# Patient Record
Sex: Male | Born: 1975 | Race: White | Hispanic: No | Marital: Married | Smoking: Current every day smoker
Health system: Southern US, Community
[De-identification: ages and names within clinical notes are randomized; demographics above are authoritative.]

## PROBLEM LIST (undated history)

## (undated) DIAGNOSIS — K259 Gastric ulcer, unspecified as acute or chronic, without hemorrhage or perforation: Secondary | ICD-10-CM

## (undated) DIAGNOSIS — D3A092 Benign carcinoid tumor of the stomach: Secondary | ICD-10-CM

## (undated) DIAGNOSIS — M549 Dorsalgia, unspecified: Secondary | ICD-10-CM

## (undated) HISTORY — PX: BACK SURGERY: SHX140

---

## 2003-09-12 ENCOUNTER — Emergency Department (HOSPITAL_COMMUNITY): Admission: EM | Admit: 2003-09-12 | Discharge: 2003-09-12 | Payer: Self-pay | Admitting: *Deleted

## 2003-10-12 ENCOUNTER — Inpatient Hospital Stay (HOSPITAL_COMMUNITY): Admission: AC | Admit: 2003-10-12 | Discharge: 2003-10-14 | Payer: Self-pay

## 2003-11-24 ENCOUNTER — Encounter: Admission: RE | Admit: 2003-11-24 | Discharge: 2003-11-24 | Payer: Self-pay | Admitting: Neurological Surgery

## 2003-12-09 ENCOUNTER — Ambulatory Visit (HOSPITAL_COMMUNITY): Admission: RE | Admit: 2003-12-09 | Discharge: 2003-12-09 | Payer: Self-pay | Admitting: Unknown Physician Specialty

## 2004-03-25 ENCOUNTER — Encounter: Admission: RE | Admit: 2004-03-25 | Discharge: 2004-03-25 | Payer: Self-pay | Admitting: Specialist

## 2004-05-04 ENCOUNTER — Encounter: Admission: RE | Admit: 2004-05-04 | Discharge: 2004-05-04 | Payer: Self-pay | Admitting: Specialist

## 2004-06-12 ENCOUNTER — Ambulatory Visit: Payer: Self-pay | Admitting: Psychiatry

## 2004-06-13 ENCOUNTER — Inpatient Hospital Stay (HOSPITAL_COMMUNITY): Admission: EM | Admit: 2004-06-13 | Discharge: 2004-06-18 | Payer: Self-pay | Admitting: Psychiatry

## 2004-07-08 ENCOUNTER — Inpatient Hospital Stay (HOSPITAL_COMMUNITY): Admission: RE | Admit: 2004-07-08 | Discharge: 2004-07-12 | Payer: Self-pay | Admitting: Specialist

## 2004-07-17 ENCOUNTER — Emergency Department (HOSPITAL_COMMUNITY): Admission: EM | Admit: 2004-07-17 | Discharge: 2004-07-18 | Payer: Self-pay | Admitting: Emergency Medicine

## 2004-09-18 ENCOUNTER — Emergency Department (HOSPITAL_COMMUNITY): Admission: EM | Admit: 2004-09-18 | Discharge: 2004-09-18 | Payer: Self-pay

## 2006-08-28 IMAGING — CT CT HEAD W/O CM
1 of 2 series · 13 of 30 positions shown, 17 images · non-contrast
Comparison: none

CLINICAL DATA: Seizure, no headache

[Series 2: brain · axial · 0.47mm/px · z∈[+169,+291]mm · 13 of 28 slices shown, 17 images]
[im 2/28  brain]
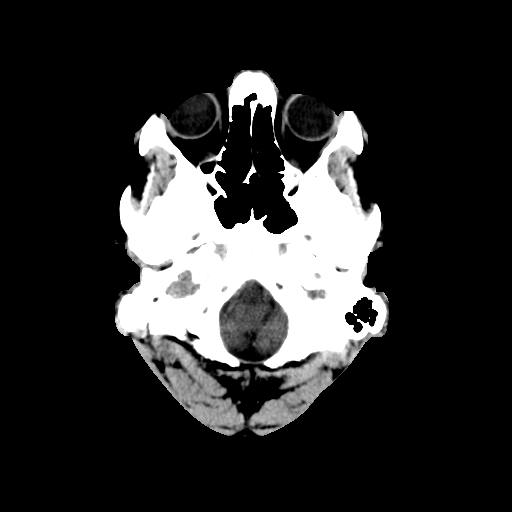
[im 2/28  bone]
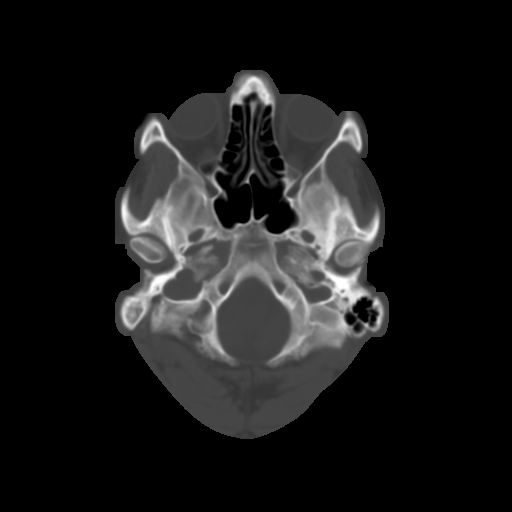
[im 4/28  brain]
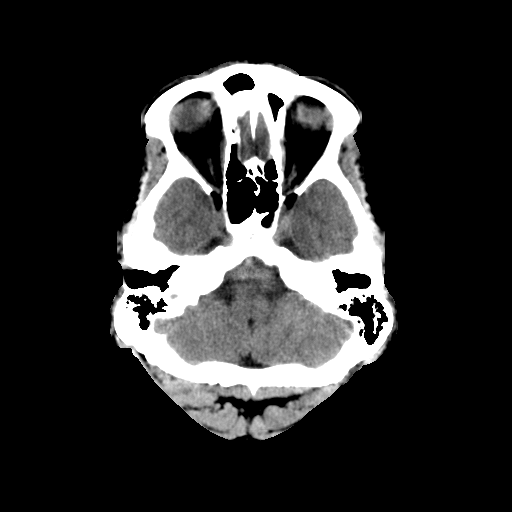
[im 6/28  brain]
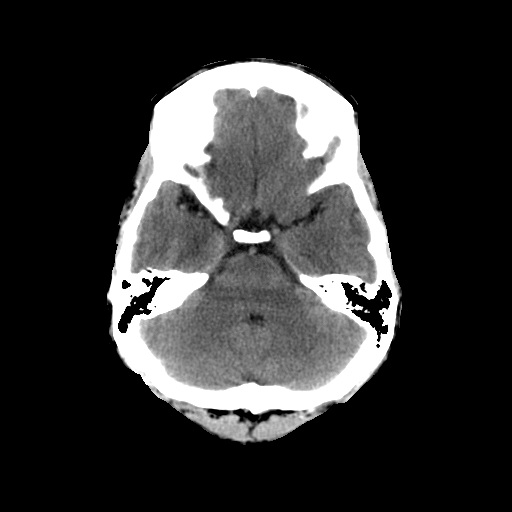
[im 8/28  brain]
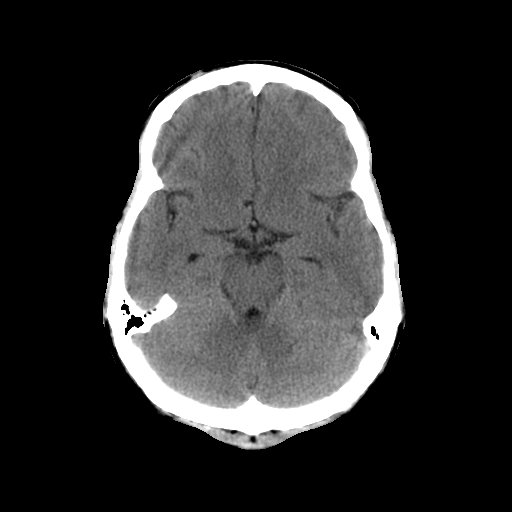
[im 10/28  brain]
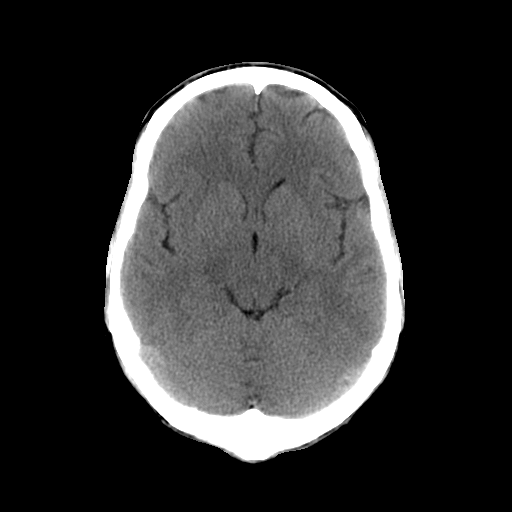
[im 10/28  bone]
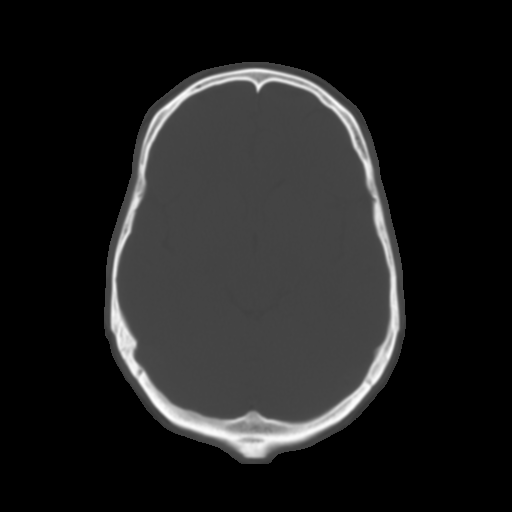
[im 12/28  brain]
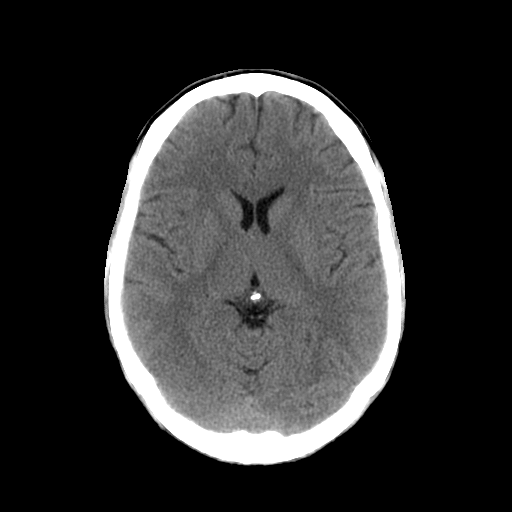
[im 14/28  brain]
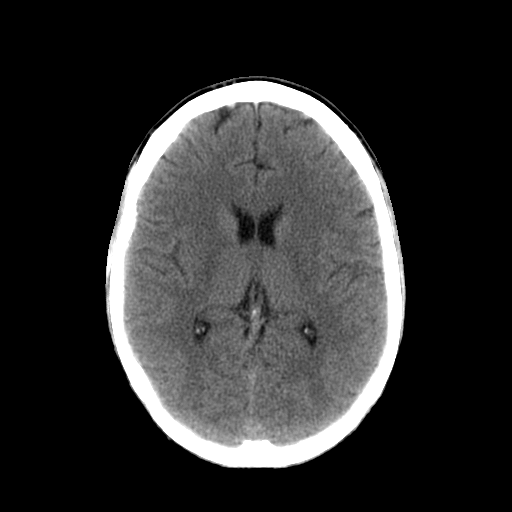
[im 16/28  brain]
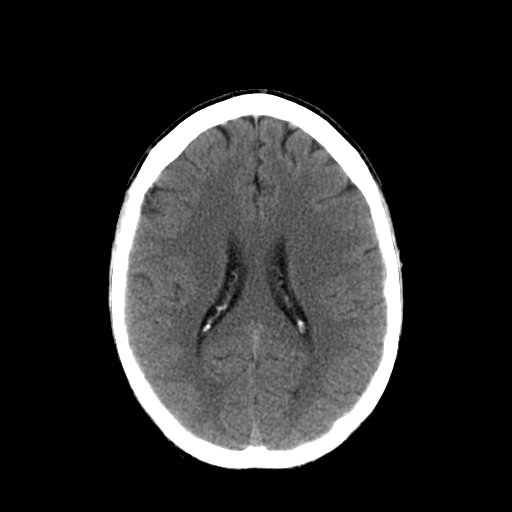
[im 18/28  brain]
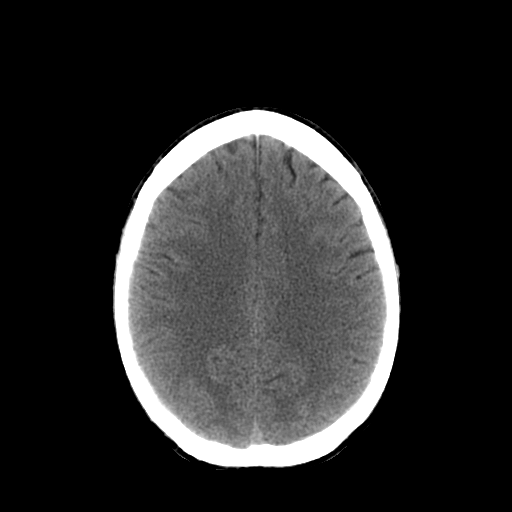
[im 18/28  bone]
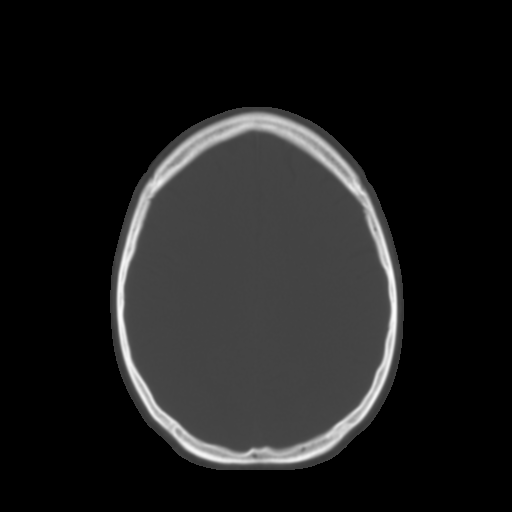
[im 20/28  brain]
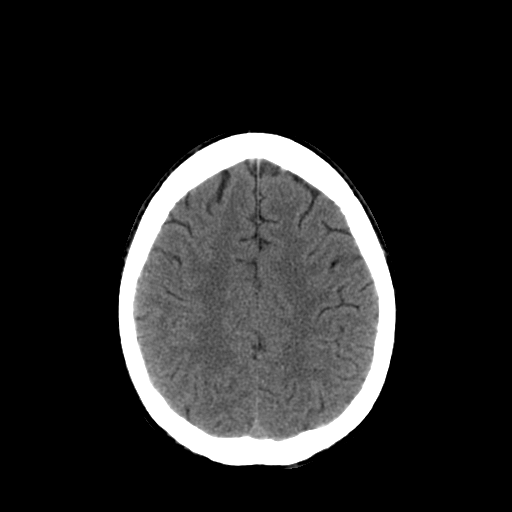
[im 22/28  brain]
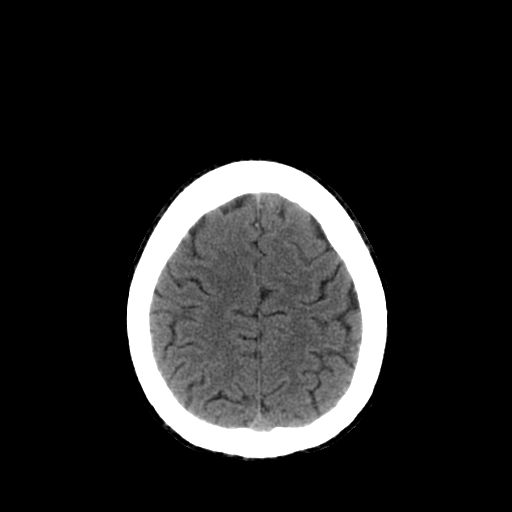
[im 24/28  brain]
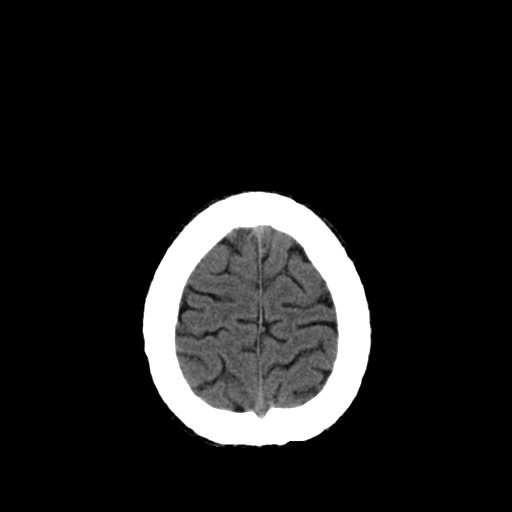
[im 26/28  brain]
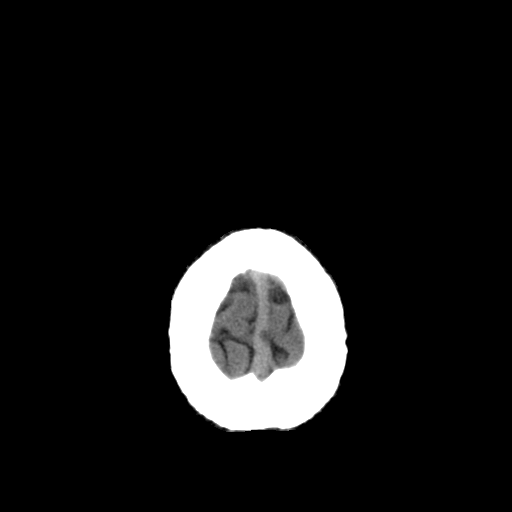
[im 26/28  bone]
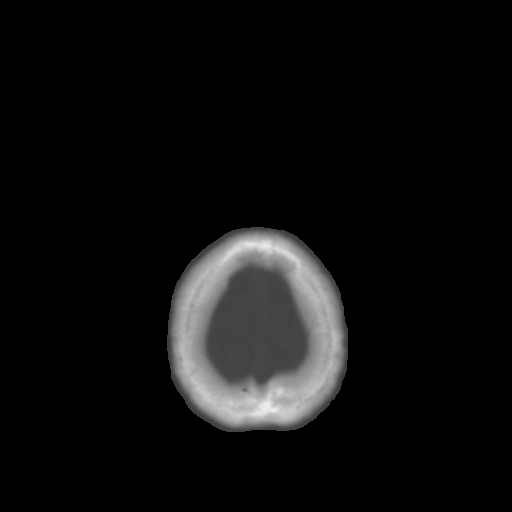

[13 of 30 positions shown; findings below may reference images not displayed]

CT head without contrast:

Comparison 09/12/2003. There is no evidence of intracranial hemorrhage, brain
edema, or mass effect.  No other intra-axial abnormalities are seen, and the
ventricles are within normal limits.  No abnormal extra-axial fluid collections
or masses are identified.  No skull abnormalities are noted.
IMPRESSION: 1. Negative non-contrast head CT.

## 2019-04-10 DIAGNOSIS — Z01818 Encounter for other preprocedural examination: Secondary | ICD-10-CM

## 2020-06-29 ENCOUNTER — Other Ambulatory Visit: Payer: Self-pay

## 2020-06-29 ENCOUNTER — Emergency Department (HOSPITAL_COMMUNITY)
Admission: EM | Admit: 2020-06-29 | Discharge: 2020-06-29 | Disposition: A | Discharge status: Home / Self Care | Payer: Self-pay | Attending: Emergency Medicine | Admitting: Emergency Medicine

## 2020-06-29 ENCOUNTER — Encounter (HOSPITAL_COMMUNITY): Payer: Self-pay

## 2020-06-29 DIAGNOSIS — K922 Gastrointestinal hemorrhage, unspecified: Secondary | ICD-10-CM | POA: Insufficient documentation

## 2020-06-29 DIAGNOSIS — Z85038 Personal history of other malignant neoplasm of large intestine: Secondary | ICD-10-CM | POA: Insufficient documentation

## 2020-06-29 HISTORY — DX: Gastric ulcer, unspecified as acute or chronic, without hemorrhage or perforation: K25.9

## 2020-06-29 HISTORY — DX: Dorsalgia, unspecified: M54.9

## 2020-06-29 HISTORY — DX: Benign carcinoid tumor of the stomach: D3A.092

## 2020-06-29 MED ORDER — PANTOPRAZOLE SODIUM 40 MG IV SOLR
40.0000 mg | Freq: Once | INTRAVENOUS | Status: DC
  Filled 2020-06-29: qty 40

## 2020-06-29 MED ORDER — SODIUM CHLORIDE 0.9 % IV SOLN: INTRAVENOUS | Status: DC

## 2020-06-29 MED ORDER — ONDANSETRON HCL 4 MG/2ML IJ SOLN
4.0000 mg | Freq: Once | INTRAMUSCULAR | Status: DC
  Filled 2020-06-29: qty 2

## 2020-06-29 NOTE — ED Provider Notes (Signed)
[facility] DEPT Provider Note   CSN: [id number] Arrival date & time: 06/29/20  1275     History Chief Complaint  Patient presents with  . CA Pt  . Abdominal Pain  . Hematemesis    Thomas Sullivan is a 44 y.o. male.  44 year old male with history of intestinal cancer presents with GI bleed.  Patient has decided to not receive any chemotherapy at this time.  States he has not been eating very well and today he drank coffee and took Percocet and then had one episode of vomiting blood.  Denies any black or bloody stools.  Denies any new weakness.  Patient did not want to come to the hospital but did so at the encouragement of his employer.  States that he is decided against long-term therapy.        No past medical history on file.  There are no problems to display for this patient.        No family history on file.  Social History   Tobacco Use  . Smoking status: Not on file  Substance Use Topics  . Alcohol use: Not on file  . Drug use: Not on file    Home Medications Prior to Admission medications   Not on File    Allergies    Patient has no allergy information on record.  Review of Systems   Review of Systems  All other systems reviewed and are negative.   Physical Exam Updated Vital Signs SpO2 97%   Physical Exam Vitals and nursing note reviewed.  Constitutional:      General: He is not in acute distress.    Appearance: Normal appearance. He is well-developed. He is not toxic-appearing.  HENT:     Head: Normocephalic and atraumatic.  Eyes:     General: Lids are normal.     Conjunctiva/sclera: Conjunctivae normal.     Pupils: Pupils are equal, round, and reactive to light.  Neck:     Thyroid: No thyroid mass.     Trachea: No tracheal deviation.  Cardiovascular:     Rate and Rhythm: Normal rate and regular rhythm.     Heart sounds: Normal heart sounds. No murmur heard.  No gallop.   Pulmonary:     Effort:  Pulmonary effort is normal. No respiratory distress.     Breath sounds: Normal breath sounds. No stridor. No decreased breath sounds, wheezing, rhonchi or rales.  Abdominal:     General: Bowel sounds are normal. There is no distension.     Palpations: Abdomen is soft.     Tenderness: There is abdominal tenderness in the epigastric area. There is no guarding or rebound.    Musculoskeletal:        General: No tenderness. Normal range of motion.     Cervical back: Normal range of motion and neck supple.  Skin:    General: Skin is warm and dry.     Findings: No abrasion or rash.  Neurological:     Mental Status: He is alert and oriented to person, place, and time.     GCS: GCS eye subscore is 4. GCS verbal subscore is 5. GCS motor subscore is 6.     Cranial Nerves: No cranial nerve deficit.     Sensory: No sensory deficit.  Psychiatric:        Attention and Perception: Attention normal.        Speech: Speech normal.        Behavior:  Behavior normal.     ED Results / Procedures / Treatments   Labs (all labs ordered are listed, but only abnormal results are displayed) Labs Reviewed  RESPIRATORY PANEL BY RT PCR (FLU A&B, COVID)  CBC WITH DIFFERENTIAL/PLATELET  COMPREHENSIVE METABOLIC PANEL  PROTIME-INR  APTT  TYPE AND SCREEN    EKG None  Radiology No results found.  Procedures Procedures (including critical care time)  Medications Ordered in ED Medications  0.9 %  sodium chloride infusion (has no administration in time range)    ED Course  I have reviewed the triage vital signs and the nursing notes.  Pertinent labs & imaging results that were available during my care of the patient were reviewed by me and considered in my medical decision making (see chart for details).    MDM Rules/Calculators/A&P                          Patient offered treatment he initially did agree to this.  However I was informed by nursing later that patient has decided leave AMA. Final  Clinical Impression(s) / ED Diagnoses Final diagnoses:  None    Rx / DC Orders ED Discharge Orders    None       Lacretia Leigh, MD 06/29/20 [phone]

## 2020-06-29 NOTE — ED Notes (Signed)
Pt states "I am leaving, I have already made my life decisions", pt states "I want this IV out, so I can leave my boss was over reacting", pt states "my wife is taking care of my stuff at home", pt states "I know what is wrong with me I took pain pill with coffee and no food and it made me sick".

## 2020-06-29 NOTE — ED Triage Notes (Signed)
Per EMS, Pt, c/o abdominal pain x 1 day and emesis starting this morning.  Pain score 10/10.  Hx of stomach CA.  Pt reports he doesn't want any medication.  Sts "my pill made me sick."  EMS reports Pt stopped taking chemo x 5 days ago.  Sts "he was tired of it."

## 2021-10-28 NOTE — Progress Notes (Deleted)
°  Subjective:    Thomas Sullivan - 46 y.o. male MRN [id number]  Date of birth: [birth year]  HPI  Thomas Sullivan is to establish care.   Current issues and/or concerns:  ROS per HPI     Health Maintenance:  Health Maintenance Due  Topic Date Due   COVID-19 Vaccine (1) Never done   HIV Screening  Never done   Hepatitis C Screening  Never done   TETANUS/TDAP  Never done   INFLUENZA VACCINE  Never done   COLONOSCOPY (Pts 45-51yrs Insurance coverage will need to be confirmed)  Never done     Past Medical History: There are no problems to display for this patient.     Social History   reports that he has been smoking cigarettes. He has never used smokeless tobacco. He reports that he does not currently use alcohol. He reports that he does not currently use drugs after having used the following drugs: Marijuana.   Family History  family history is not on file.   Medications: reviewed and updated   Objective:   Physical Exam There were no vitals taken for this visit. Physical Exam      Assessment & Plan:         Patient was given clear instructions to go to Emergency Department or return to medical center if symptoms don't improve, worsen, or new problems develop.The patient verbalized understanding.  I discussed the assessment and treatment plan with the patient. The patient was provided an opportunity to ask questions and all were answered. The patient agreed with the plan and demonstrated an understanding of the instructions.   The patient was advised to call back or seek an in-person evaluation if the symptoms worsen or if the condition fails to improve as anticipated.    Durene Fruits, NP 10/28/2021, 12:21 PM [facility] at [facility]

## 2021-11-02 ENCOUNTER — Ambulatory Visit: Payer: Self-pay | Admitting: Family

## 2021-11-02 DIAGNOSIS — Z7689 Persons encountering health services in other specified circumstances: Secondary | ICD-10-CM

## 2021-12-10 NOTE — Progress Notes (Signed)
Erroneous encounter

## 2021-12-13 ENCOUNTER — Encounter: Payer: Self-pay | Admitting: Family

## 2021-12-13 DIAGNOSIS — Z7689 Persons encountering health services in other specified circumstances: Secondary | ICD-10-CM
